# Patient Record
Sex: Male | Born: 1980
Health system: Southern US, Community
[De-identification: ages and names within clinical notes are randomized; demographics above are authoritative.]

## PROBLEM LIST (undated history)

## (undated) DIAGNOSIS — K219 Gastro-esophageal reflux disease without esophagitis: Secondary | ICD-10-CM

## (undated) DIAGNOSIS — I10 Essential (primary) hypertension: Secondary | ICD-10-CM

## (undated) DIAGNOSIS — Z800 Family history of malignant neoplasm of digestive organs: Secondary | ICD-10-CM

## (undated) HISTORY — DX: Essential (primary) hypertension: I10

## (undated) HISTORY — PX: OTHER SURGICAL HISTORY: SHX169

## (undated) HISTORY — DX: Family history of malignant neoplasm of digestive organs: Z80.0

## (undated) HISTORY — DX: Gastro-esophageal reflux disease without esophagitis: K21.9

---

## 2007-12-28 ENCOUNTER — Ambulatory Visit (HOSPITAL_COMMUNITY): Admission: RE | Admit: 2007-12-28 | Discharge: 2007-12-28 | Payer: Self-pay | Admitting: Neurosurgery

## 2008-01-11 ENCOUNTER — Inpatient Hospital Stay (HOSPITAL_COMMUNITY): Admission: RE | Admit: 2008-01-11 | Discharge: 2008-01-13 | Payer: Self-pay | Admitting: Neurosurgery

## 2010-07-08 NOTE — Op Note (Signed)
NAMECOUGAR, IMEL NO.:  [id number]   MEDICAL RECORD NO.:  [id number]          PATIENT TYPE:  INP   LOCATION:  3030                         FACILITY:  [facility]   PHYSICIAN:  Hewitt Shorts, M.D.DATE OF BIRTH:  Jul 09, 1980   DATE OF PROCEDURE:  01/11/2008  DATE OF DISCHARGE:                               OPERATIVE REPORT   PREOPERATIVE DIAGNOSES:  1. Grade 2, L5 on S1 spondylolisthesis.  2. Bilateral L5 pars interarticularis defect.  3. Bilateral L5-S1 neural foraminal stenosis.  4. L5-S1 lumbar disk herniation.  5. Lumbar radiculopathy.   POSTOPERATIVE DIAGNOSES:  1. Grade 2, L5 and S1 spondylolisthesis.  2. Bilateral L5 pars interarticularis defect.  3. Bilateral L5-S1 neural foraminal stenosis.  4. L5-S1 lumbar disk herniation.  5. Lumbar radiculopathy.   PROCEDURE:  L5 Gill procedure with laminectomy and facetectomy and  foraminotomy with decompression of the exiting L5 and S1 nerve roots  bilaterally.  A L5-S1 posterior lumbar interbody fusion with AVS PEEK  interbody implants and mosaic with bone marrow aspirate and L5-S1  posterolateral arthrodesis with radius posterior instrumentation,  infused locally harvested morselized autograft, and mosaic with bone  marrow aspirate with microdissection.   SURGEON:  Hewitt Shorts, MD.   ASSISTANT:  Coletta Memos, MD   ANESTHESIA:  General endotracheal.   INDICATIONS:  The patient is a 30 year old man who presented with low  back pain and left lumbar radicular pain.  He was noted to have a grade  2 spondylolisthesis, L5 and S1 secondary to bilateral L5 pars  interarticularis defect.  There is a large broad-based disk protrusion  with neural foraminal stenosis.  A decision was made to proceed with  decompression and arthrodesis.   PROCEDURE:  The patient was brought to the operating room and placed  under general endotracheal anesthesia.  The patient was turned to a  prone position.  Lumbar region  was prepped with Betadine soap and  solution, and draped in a sterile fashion.  The midline was infiltrated  with local anesthetic with epinephrine.  X-ray was taken.  The L5-S1  level identified and a midline incision was made, carried down through  the subcutaneous tissue.  Bipolar cautery and electrocautery were used  to maintain hemostasis.  Dissection was carried down to the lumbar  fascia, which was incised bilaterally and the paraspinal muscles were  dissected from the spinous process and lamina in a subperiosteal  fashion.  Another x-ray was taken, L5-S1 interlaminar space was  identified, and a dissection was carried down laterally and self-  retaining retractor was placed.  The posterior element of L5 was  extensively loose.  We dissected around it separating the ligamentum  flavum and removing the ligamentum flavum at the L5-S1 level and the L4-  5 level.  We were then able to remove the L5 posterior element en bloc.  It was cleaned to soft tissue and morselized and later used as bone  graft.  Then with magnification and microdissection and microsurgery  technique, we proceeded with decompression.  There was significant  pseudoarthrosis encroaching and impinging on the L5 nerve  roots  bilaterally.  This was carefully removed decompressing the nerve roots  bilaterally.  We removed the thickened ligamentum flavum as well  decompressing actually the S1 nerve roots.   We then proceeded with the diskectomy.  There was a large listhesis  between L5 and S1 with a broad-based disk protrusion, spondylitic.  We  incised the annulus and entered into the disk space and began a  diskectomy using a variety of micro curettes and pituitary rongeurs.  The corresponding lipping from the posterosuperior edge of S1 was  removed using Kerrison punches and the decompression was continued  laterally to decompress the L5 nerve roots on the ventral surface  laterally.  We then proceeded with preparing  the vertebral body  endplates using paddle curette to remove the cartilaginous endplate  surfaces.  We then measured the height of the  intervertebral disk space  and selected a 9-mm height implant, 20 mm of depth.  The C-arm  fluoroscope was then draped and brought into the field to guide our  probing of pedicles.  We first probed the left L5 pedicle and aspirated  bone marrow aspirate from the vertebral body which was injected over a  15-mL strip of Mosaic.  The bone marrow aspirate was then packed within  the AVS PEEK implant and we first placed the implant on the left side.  We packed additional Mosaic with bone marrow aspirate in the midline and  then placed the second implant on the right side.  We placed the implant  on the thecal sac and nerve root, we gently retracted towards the  midline.  Each of the implants was placed using C-arm fluoroscopic  guidance to ensure that they were countersunk relative to the L5  vertebral body, which was more ventral.   We then proceeded with posterolateral arthrodesis.  We went ahead and  probed the pedicles of L5 on the right side and S1 bilaterally.  Each of  the pedicles was examined with a ball probe.  Good bony surfaces were  noted.  We then tapped each of the pedicles with 5.25 mm tap, again  examined with a ball probe.  Good bony surfaces were noted.  Good  threading was noted.  We then placed 5.75-mm screws placing 15-mm screws  at L5 and 40-mm screws at S1.  We used 30-mm prelordosed rods that were  placed within the screw heads and secured with locking caps which were  subsequently tightened against the counter torque.  We decorticated the  transverse processes at L5 bilaterally as well as of the sacral ala and  we packed Infuse Mosaic with bone marrow aspirate and locally harvested  morselized autograft in the lateral gutters.  We took care to avoid any  encroachment or impingement on the nerve roots of thecal sac and this  was  examined at the conclusion of packing the bone graft material.  The  wound had been irrigated numerous times throughout the procedure with  saline solution and bacitracin solution.  Good hemostasis was  established and we proceeded with closure.  The paraspinal muscles were  approximated with interrupted undyed #1 Vicryl sutures.  The deep fascia  with interrupted undyed #1 Vicryl sutures.  Scarpa fascia was closed  with interrupted undyed #1 Vicryl sutures.  The subcutaneous and  subcuticular were closed with interrupted inverted 2-0 undyed Vicryl  sutures.  The skin incision was closed with surgical staples.  The wound  was dressed with Adaptic and sterile gauze and Hypafix.  The procedure  was tolerated well.  The estimated blood loss was 250 mL.  We did use  cell saver during the procedure, but the cell saver technician felt that  there was insufficient blood loss to process the collected specimen.  Sponge and needle count were correct.  Following surgery, the patient  was turned back to the supine position, reversed from the anesthetic,  extubated, and transferred to the recovery room for further care.      Hewitt Shorts, M.D.  Electronically Signed     RWN/MEDQ  D:  01/11/2008  T:  01/12/2008  Job:  [id number]

## 2010-11-25 LAB — CBC
HCT: 44.3
HCT: 47.2
Hemoglobin: 15.7
Hemoglobin: 16.4
MCHC: 34.6
MCHC: 35.6
MCV: 87.5
MCV: 89
Platelets: 226
Platelets: 255
RBC: 5.06
RBC: 5.3
RDW: 12.2
RDW: 12.5
WBC: 5.9
WBC: 8.7

## 2010-11-25 LAB — TYPE AND SCREEN
ABO/RH(D): A POS
ABO/RH(D): A POS
Antibody Screen: NEGATIVE
Antibody Screen: NEGATIVE

## 2010-11-25 LAB — ABO/RH: ABO/RH(D): A POS

## 2014-05-14 ENCOUNTER — Telehealth: Payer: Self-pay | Admitting: Genetic Counselor

## 2014-05-14 NOTE — Telephone Encounter (Signed)
Pt aware of genetic appt on 05/21/14@1 :00

## 2014-05-14 NOTE — Telephone Encounter (Signed)
Called and left pt vm in ref to appt.

## 2014-05-21 ENCOUNTER — Encounter: Payer: Self-pay | Admitting: Genetic Counselor

## 2014-05-21 ENCOUNTER — Other Ambulatory Visit: Payer: BLUE CROSS/BLUE SHIELD

## 2014-05-21 ENCOUNTER — Ambulatory Visit: Payer: Self-pay

## 2014-05-21 ENCOUNTER — Ambulatory Visit (HOSPITAL_BASED_OUTPATIENT_CLINIC_OR_DEPARTMENT_OTHER): Payer: BLUE CROSS/BLUE SHIELD | Admitting: Genetic Counselor

## 2014-05-21 DIAGNOSIS — Z800 Family history of malignant neoplasm of digestive organs: Secondary | ICD-10-CM | POA: Diagnosis not present

## 2014-05-21 DIAGNOSIS — Z315 Encounter for genetic counseling: Secondary | ICD-10-CM

## 2014-05-21 NOTE — Progress Notes (Signed)
REFERRING PROVIDER: No referring provider defined for this encounter.  PRIMARY PROVIDER:  No primary care provider on file.  PRIMARY REASON FOR VISIT:  1. Family history of colon cancer   2. Family history of Lynch syndrome      HISTORY OF PRESENT ILLNESS:   Kyle Tyler, a 34 y.o. male, was seen for a [place] cancer genetics consultation at the request of Dr. No ref. provider found due to a family history of colon cancer and known family mutation in [place].  Kyle Tyler presents to clinic today to discuss the possibility of a hereditary predisposition to cancer, genetic testing, and to further clarify his future cancer risks, as well as potential cancer risks for family members. Kyle Tyler is a 34 y.o. male with no personal history of cancer. He is seen with his mother today.  Kyle Tyler has undergone surgery providing his mother with part of his liver. Kyle Tyler mother has been diagnosed with colon cancer and was found to have an MLH1 mutation that is known in the family.   CANCER HISTORY:   No history exists.     RISK FACTORS:  Colonoscopy: yes; normal. Polyps: no polyps by report Smoking history: former smoker of 1/3 ppd for several years. Alcohol use: rare  Past Medical History  Diagnosis Date  . Family history of colon cancer   . Family history of Lynch syndrome     History reviewed. No pertinent past surgical history.  History   Social History  . Marital Status: Married    Spouse Name: N/A  . Number of Children: N/A  . Years of Education: N/A   Social History Main Topics  . Smoking status: Former Smoker -- 0.33 packs/day for 3 years    Types: Cigarettes  . Smokeless tobacco: Not on file  . Alcohol Use: Yes     Comment: rarely  . Drug Use: Not on file  . Sexual Activity: Not on file   Other Topics Concern  . None   Social History Narrative  . None     FAMILY HISTORY:  We obtained a detailed, 4-generation family history.  Significant diagnoses are  listed below: Family History  Problem Relation Age of Onset  . Colon cancer Mother 59    MLH1 mutation; Lynch syndrome  . Colon cancer Maternal Aunt 84    MLH1 mutation  . Colon cancer Maternal Grandfather 50    recurrance at 39  . Colon cancer Cousin 30    stage IV; Lynch syndrome MLH1  . Colon cancer Other     paternal grandfather's brothers   Kyle Tyler has an 34 year old son who is healthy.  He is an only child.  His mother was diagnosed with colon cancer at 53 and also has an MLH1 mutation.  Kyle Tyler mother had a brother and sister.  Her brother died at 32. Her sister was diagnosed with colon cancer at 71 and was found to have an MLH1 mutation.  Kyle Tyler niece was diagnosed with stage IV colon cancer at age 48 and also has Lynch syndrome.  Kyle Tyler maternal grandfather had colon cancer at 57 and 62, and his two brothers also had colon cancer. Patient's maternal ancestors are of Caucasian descent, and paternal ancestors are of Caucasian descent. There is no reported Ashkenazi Jewish ancestry. There is no known consanguinity.  GENETIC COUNSELING ASSESSMENT: Kyle Tyler is a 34 y.o. male with a family history of colon cancer and known family mutation in  MLH1 and a VUS in MSH6 which somewhat suggestive of a Lynch syndrome and predisposition to cancer. We, therefore, discussed and recommended the following at today's visit.   DISCUSSION: We reviewed the characteristics, features and inheritance patterns of hereditary cancer syndromes. We discussed Lynch syndrome and the increased risk for cancer in the GI tract and male reproductive organs.  We reviewed the increased screening for individuals with Lynch syndrome, as well as discussed the risk for having inherited this mutation.  Based on his mother's test result, Kyle Tyler has a 50% chance of also having this mutation.  If he tests positive we will refer him to our high risk cancer clinic and can also refer him for colonoscopies  to local providers, if he would like.  We also discussed genetic testing, including the appropriate family members to test, the process of testing, insurance coverage and turn-around-time for results. We discussed the implications of a negative and positive result. We recommended Kyle Tyler pursue genetic testing for the [facility] and MSH6 targeted test through [facility], as his mother's test was also through [facility].  PLAN: After considering the risks, benefits, and limitations, Kyle Tyler  provided informed consent to pursue genetic testing and the blood sample was sent to [facility] for a targeted analysis of the MLH1 and MSH6 genes. Results should be available within approximately 2 weeks' time, at which point they will be disclosed by telephone to Kyle Tyler, as will any additional recommendations warranted by these results. Kyle Tyler will receive a summary of his genetic counseling visit and a copy of his results once available. This information will also be available in Epic. We encouraged Kyle Tyler to remain in contact with cancer genetics annually so that we can continuously update the family history and inform him of any changes in cancer genetics and testing that may be of benefit for his family. Kyle Tyler questions were answered to his satisfaction today. Our contact information was provided should additional questions or concerns arise.  Lastly, we encouraged Mr. Whitmoyer to remain in contact with cancer genetics annually so that we can continuously update the family history and inform him of any changes in cancer genetics and testing that may be of benefit for this family.   Mr.  Voong questions were answered to his satisfaction today. Our contact information was provided should additional questions or concerns arise. Thank you for the referral and allowing [place] to share in the care of your patient.   Karen P. Florene Glen, [place], [facility] Certified Genetic  Counselor [email or web address] phone: [phone]  The patient was seen for a total of 45 minutes in face-to-face genetic counseling.  This patient was discussed with Drs. Magrinat, Lindi Adie and/or Burr Medico who agrees with the above.    _______________________________________________________________________ For Office Staff:  Number of people involved in session: 2 Was an Intern/ student involved with case: yes

## 2014-06-01 ENCOUNTER — Encounter: Payer: Self-pay | Admitting: Genetic Counselor

## 2014-06-01 ENCOUNTER — Telehealth: Payer: Self-pay | Admitting: Genetic Counselor

## 2014-06-01 DIAGNOSIS — Z1379 Encounter for other screening for genetic and chromosomal anomalies: Secondary | ICD-10-CM

## 2014-06-01 DIAGNOSIS — Z800 Family history of malignant neoplasm of digestive organs: Secondary | ICD-10-CM

## 2014-06-01 NOTE — Progress Notes (Signed)
HPI: Kyle Tyler was previously seen in the [place] clinic due to a family history of colon cancer cancer and a known family mutation in [place] in his mother causing Lynch syndrome.  Please refer to our prior cancer genetics clinic note for more information regarding Kyle Tyler medical, social and family histories, and our assessment and recommendations, at the time. Kyle Tyler genetic test results were disclosed to him, as were recommendations warranted by these results. These results and recommendations are discussed in more detail below.  GENETIC TEST RESULTS: We recommended Kyle Tyler pursue testing for the familial hereditary cancer gene mutation called MLH1, [id number].  Kyle Tyler test was normal and did not reveal the familial mutation. We call this result a true negative result because the cancer-causing mutation was identified in Kyle Tyler family, and he did not inherit it.  Given this negative result, Kyle Tyler chances of developing Lynch-related cancers are the same as they are in the general population.    Genetic testing did detect a Variant of Unknown Significance in the MSH6 gene called [id number]>T. At this time, it is unknown if this variant is associated with increased cancer risk or if this is a normal finding, but most variants such as this get reclassified to being inconsequential. It should not be used to make medical management decisions. With time, we suspect the lab will determine the significance of this variant, if any. If we do learn more about it, we will try to contact Kyle Tyler to discuss it further. However, it is important to stay in touch with [place] periodically and keep the address and phone number up to date.   ADDITIONAL GENETIC TESTING: We discussed with Kyle Tyler that there are other genes that are associated with increased cancer risk that can be analyzed. The laboratories that offer such testing look at these additional genes via a  hereditary cancer gene panel. Should Kyle Tyler wish to pursue additional genetic testing, we are happy to discuss and coordinate this testing, at any time.    CANCER SCREENING RECOMMENDATIONS: This normal result is reassuring and indicates that Kyle Tyler does not likely have an increased risk of cancer due to a mutation in one of these genes.  We, therefore, recommended  Kyle Tyler continue to follow the cancer screening guidelines provided by his primary healthcare providers.   RECOMMENDATIONS FOR FAMILY MEMBERS: All of Kyle Tyler maternal family members should be tested for the MLH1 mutation.  IF they are positive they would have Lynch syndrome and would need to be followed accordingly.  FOLLOW-UP: Lastly, we discussed with Kyle Tyler that cancer genetics is a rapidly advancing field and it is possible that new genetic tests will be appropriate for him and/or his family members in the future. We encouraged him to remain in contact with cancer genetics on an annual basis so we can update his personal and family histories and let him know of advances in cancer genetics that may benefit this family.   Our contact number was provided. Kyle Tyler questions were answered to his satisfaction, and she knows he is welcome to call us at anytime with additional questions or concerns.   Kyle Kayser, MS, [facility] Certified Genetic Counselor [email or web address]

## 2014-06-01 NOTE — Telephone Encounter (Signed)
Revealed negative testing for the known fmaily mutation.

## 2014-06-01 NOTE — Telephone Encounter (Signed)
LM on VM that we had results back and to please call back.

## 2015-01-21 ENCOUNTER — Other Ambulatory Visit: Payer: Self-pay | Admitting: Sports Medicine

## 2015-01-21 DIAGNOSIS — M25531 Pain in right wrist: Secondary | ICD-10-CM

## 2015-02-07 ENCOUNTER — Ambulatory Visit
Admission: RE | Admit: 2015-02-07 | Discharge: 2015-02-07 | Disposition: A | Discharge status: Home / Self Care | Payer: BLUE CROSS/BLUE SHIELD | Source: Ambulatory Visit | Attending: Sports Medicine | Admitting: Sports Medicine

## 2015-02-07 DIAGNOSIS — M25531 Pain in right wrist: Secondary | ICD-10-CM

## 2015-02-07 MED ORDER — IOHEXOL 180 MG/ML  SOLN: 2.0000 mL | Freq: Once | INTRAMUSCULAR | Status: DC | PRN

## 2016-08-11 ENCOUNTER — Encounter: Payer: Self-pay | Admitting: General Surgery

## 2016-08-11 ENCOUNTER — Ambulatory Visit (INDEPENDENT_AMBULATORY_CARE_PROVIDER_SITE_OTHER): Payer: Managed Care, Other (non HMO) | Admitting: General Surgery

## 2016-08-11 VITALS — BP 142/80 | HR 68 | Temp 98.4°F | Resp 18 | Ht 73.0 in | Wt 253.0 lb

## 2016-08-11 DIAGNOSIS — K432 Incisional hernia without obstruction or gangrene: Secondary | ICD-10-CM | POA: Diagnosis not present

## 2016-08-11 NOTE — Patient Instructions (Signed)
 Ventral Hernia A ventral hernia is a bulge of tissue from inside the abdomen that pushes through a weak area of the muscles that form the front wall of the abdomen. The tissues inside the abdomen are inside a sac (peritoneum). These tissues include the small intestine, large intestine, and the fatty tissue that covers the intestines (omentum). Sometimes, the bulge that forms a hernia contains intestines. Other hernias contain only fat. Ventral hernias do not go away without surgical treatment. There are several types of ventral hernias. You may have:  A hernia at an incision site from previous abdominal surgery (incisional hernia).  A hernia just above the belly button (epigastric hernia), or at the belly button (umbilical hernia). These types of hernias can develop from heavy lifting or straining.  A hernia that comes and goes (reducible hernia). It may be visible only when you lift or strain. This type of hernia can be pushed back into the abdomen (reduced).  A hernia that traps abdominal tissue inside the hernia (incarcerated hernia). This type of hernia does not reduce.  A hernia that cuts off blood flow to the tissues inside the hernia (strangulated hernia). The tissues can start to die if this happens. This is a very painful bulge that cannot be reduced. A strangulated hernia is a medical emergency.  What are the causes? This condition is caused by abdominal tissue putting pressure on an area of weakness in the abdominal muscles. What increases the risk? The following factors may make you more likely to develop this condition:  Being male.  Being 60 or older.  Being overweight or obese.  Having had previous abdominal surgery, especially if there was an infection after surgery.  Having had an injury to the abdominal wall.  Having had several pregnancies.  Having a buildup of fluid inside the abdomen (ascites).  What are the signs or symptoms? The only symptom of a ventral  hernia may be a painless bulge in the abdomen. A reducible hernia may be visible only when you strain, cough, or lift. Other symptoms may include:  Dull pain.  A feeling of pressure.  Signs and symptoms of a strangulated hernia may include:  Increasing pain.  Nausea and vomiting.  Pain when pressing on the hernia.  The skin over the hernia turning red or purple.  Constipation.  Blood in the stool (feces).  How is this diagnosed? This condition may be diagnosed based on:  Your symptoms.  Your medical history.  A physical exam. You may be asked to cough or strain while standing. These actions increase the pressure inside your abdomen and force the hernia through the opening in your muscles. Your health care provider may try to reduce the hernia by pressing on it.  Imaging studies, such as an ultrasound or CT scan.  How is this treated? This condition is treated with surgery. If you have a strangulated hernia, surgery is done as soon as possible. If your hernia is small and not incarcerated, you may be asked to lose some weight before surgery. Follow these instructions at home:  Follow instructions from your health care provider about eating or drinking restrictions.  If you are overweight, your health care provider may recommend that you increase your activity level and eat a healthier diet.  Do not lift anything that is heavier than 10 lb (4.5 kg).  Return to your normal activities as told by your health care provider. Ask your health care provider what activities are safe for you. You   may need to avoid activities that increase pressure on your hernia.  Take over-the-counter and prescription medicines only as told by your health care provider.  Keep all follow-up visits as told by your health care provider. This is important. Contact a health care provider if:  Your hernia gets larger.  Your hernia becomes painful. Get help right away if:  Your hernia becomes  increasingly painful.  You have pain along with any of the following: ? Changes in skin color in the area of the hernia. ? Nausea. ? Vomiting. ? Fever. Summary  A ventral hernia is a bulge of tissue from inside the abdomen that pushes through a weak area of the muscles that form the front wall of the abdomen.  This condition is treated with surgery, which may be urgent depending on your hernia.  Do not lift anything that is heavier than 10 lb (4.5 kg), and follow activity instructions from your health care provider. This information is not intended to replace advice given to you by your health care provider. Make sure you discuss any questions you have with your health care provider. Document Released: 01/27/2012 Document Revised: 09/27/2015 Document Reviewed: 09/27/2015 Elsevier Interactive Patient Education  2018 [employer]  

## 2016-08-11 NOTE — Progress Notes (Signed)
Kyle Tyler; [id number]; 11-22-1980   HPI   Patient is a 36 year old white male who was referred to my care by Dr. Nadara Mustard for evaluation and treatment of incisional hernia.  He is status post donor partial hepatectomy at [facility] for his mother.  This was done 3 years ago.  He states he did well after the surgery, but over time, he did gain weight and started noticing swelling along the incision line.  This was made worse with straining.  He has no pain with this.  No nausea or vomiting noted. Past Medical History:  Diagnosis Date  . Family history of colon cancer   . Family history of Lynch syndrome     No past surgical history on file.  Family History  Problem Relation Age of Onset  . Colon cancer Mother 36       MLH1 mutation; Lynch syndrome  . Colon cancer Maternal Aunt 53       MLH1 mutation  . Colon cancer Maternal Grandfather 50       recurrance at 66  . Colon cancer Cousin 30       stage IV; Lynch syndrome MLH1  . Colon cancer Other        paternal grandfather's brothers    No current outpatient prescriptions on file prior to visit.   No current facility-administered medications on file prior to visit.     Not on File  History  Alcohol Use  . Yes    Comment: rarely    History  Smoking Status  . Former Smoker  . Packs/day: 0.33  . Years: 3.00  . Types: Cigarettes  Smokeless Tobacco  . Never Used    Review of Systems  Constitutional: Negative.   HENT: Negative.   Eyes: Negative.   Respiratory: Negative.   Cardiovascular: Negative.   Gastrointestinal: Negative.   Genitourinary: Negative.   Musculoskeletal: Negative.   Skin: Negative.   Neurological: Negative.   Endo/Heme/Allergies: Negative.   Psychiatric/Behavioral: Negative.     Objective   Vitals:   08/11/16 1541  BP: (!) 142/80  Pulse: 68  Resp: 18  Temp: 98.4 F (36.9 C)    Physical Exam  Constitutional: He is oriented to person, place, and time and  well-developed, well-nourished, and in no distress.  HENT:  Head: Normocephalic and atraumatic.  Cardiovascular: Normal rate, regular rhythm and normal heart sounds.   No murmur heard. Pulmonary/Chest: Effort normal and breath sounds normal. He has no wheezes. He has no rales.  Abdominal: Soft. Bowel sounds are normal. He exhibits no distension. There is no tenderness.  A large midline incision is noted with almost complete incisional hernia present along its length.  The rectus muscle is palpable, but significantly lateralized along the abdominal wall.  Neurological: He is alert and oriented to person, place, and time.  Skin: Skin is warm and dry.  Vitals reviewed.   Assessment    Incisional hernia Plan    given the very large hernia defect and lateral displacement of the rectus muscle, I told the patient that I felt he would benefit from possible component separation hernia repair.  I do not perform this procedure.  I will refer him back to [facility] for evaluation and treatment.  He understands this and agrees.

## 2017-07-28 IMAGING — MR MR WRIST*R* W/ CM
4 of 5 series · 23 of 40 positions shown · non-contrast
Comparison: Limited injection images from 02/07/2015

CLINICAL DATA: Right wrist pain for 2 months since using a
lawnmower and jamming the wrist. Medial wrist pain.

EXAM:
MR OF THE RIGHT WRIST WITH INTRA-ARTICULAR CONTRAST
TECHNIQUE: Multiplanar, multisequence MR imaging of the right wrist was
performed following the administration of intra-articular contrast
in the radiocarpal joint.

[Series 5: T2 fat-sat · axial · 3.5mm · 0.20mm/px · z∈[-52,+23]mm · 7 of 20 slices shown (1 of 2)]
[im 1/20]
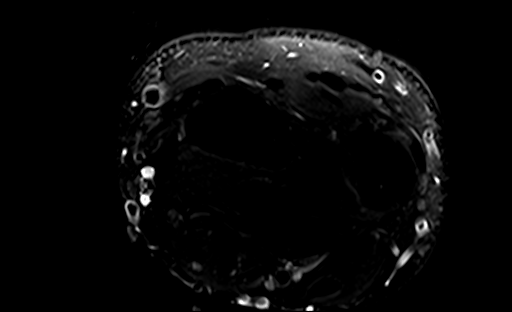
[im 4/20]
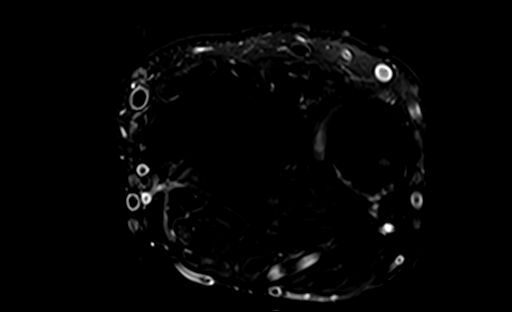
[im 7/20]
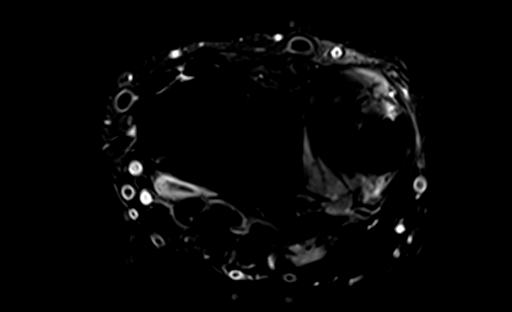
[im 10/20]
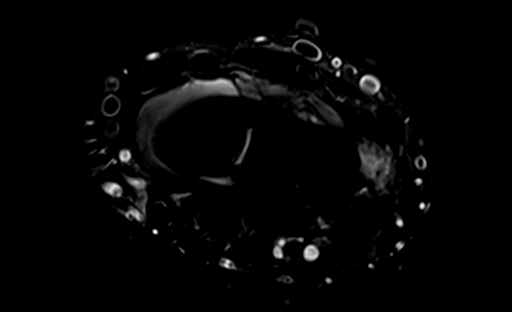
[im 13/20]
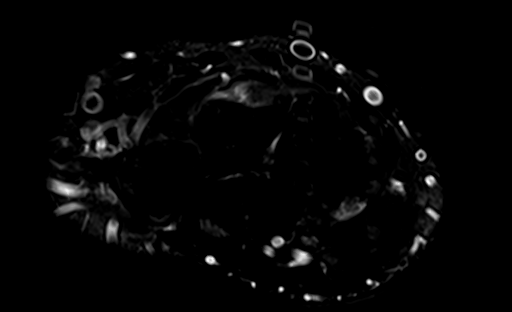
[im 16/20]
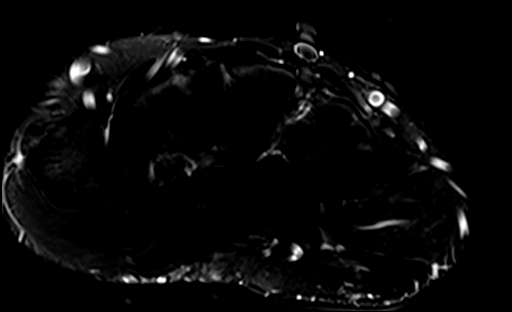
[im 20/20]
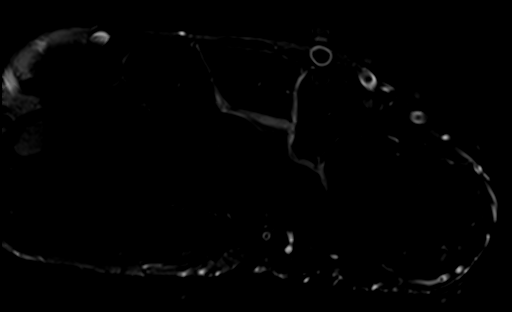

[Series 6: T1 fat-sat · coronal · 2.5mm · 0.20mm/px · 5 of 21 slices shown (1 of 2)]
[im 1/21]
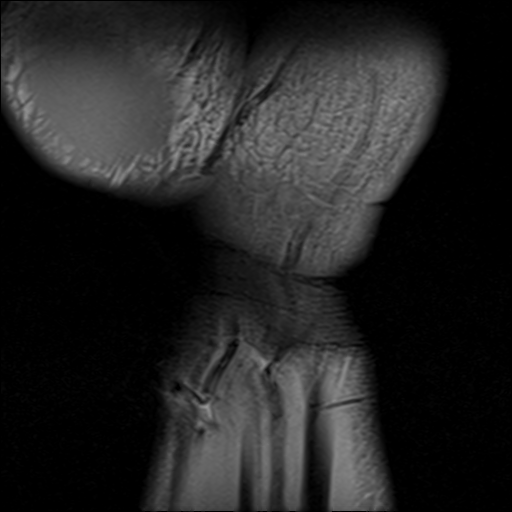
[im 3/21]
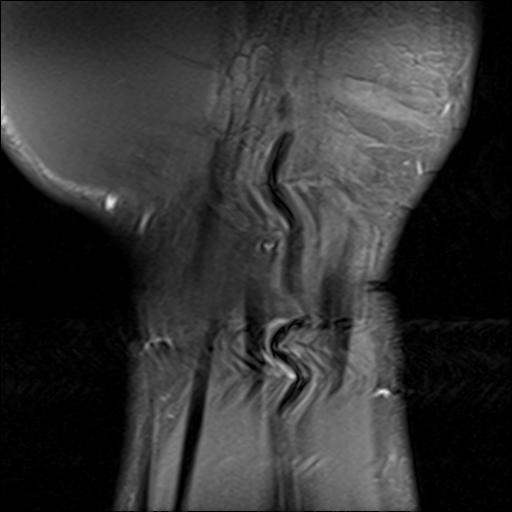
[im 6/21]
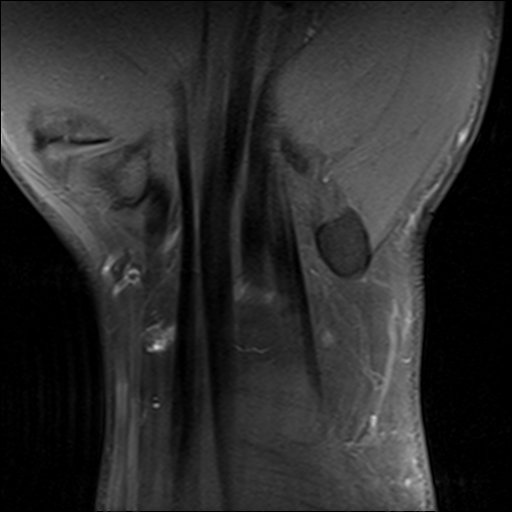
[im 12/21]
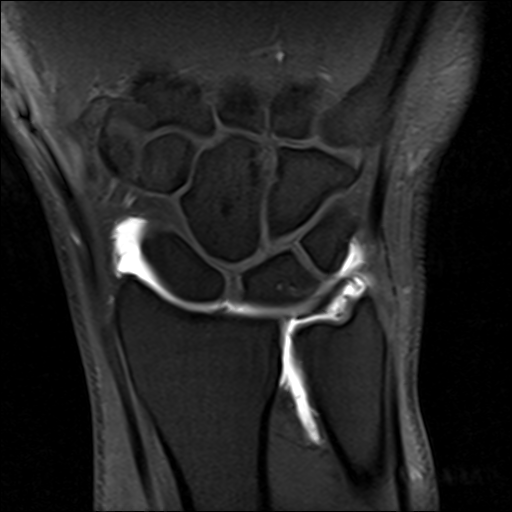
[im 18/21]
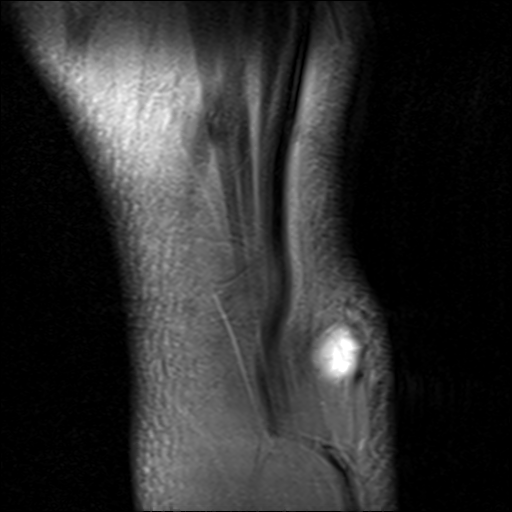

[Series 7: T1 fat-sat · sagittal · 3.0mm · 0.20mm/px · 3 of 25 slices shown (2 of 2)]
[im 4/25]
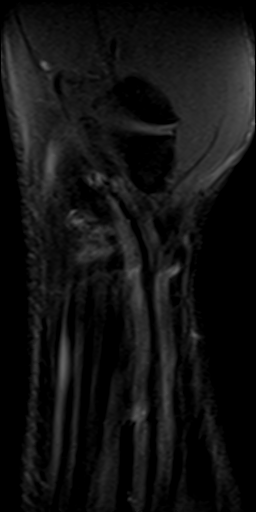
[im 13/25]
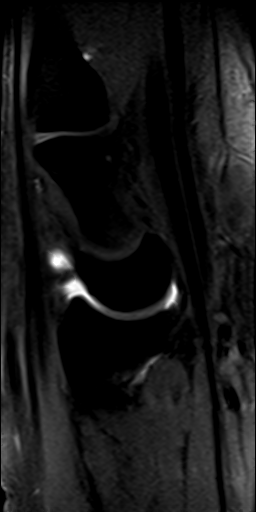
[im 22/25]
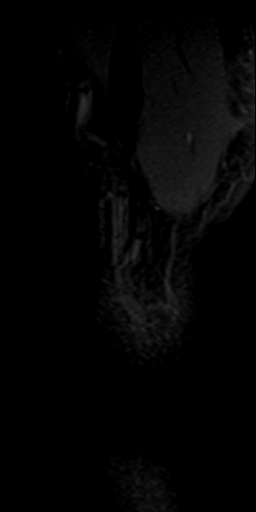

[Series 9: T2 fat-sat · coronal · 2.5mm · 0.20mm/px · 8 of 21 slices shown (2 of 2)]
[im 1/21]
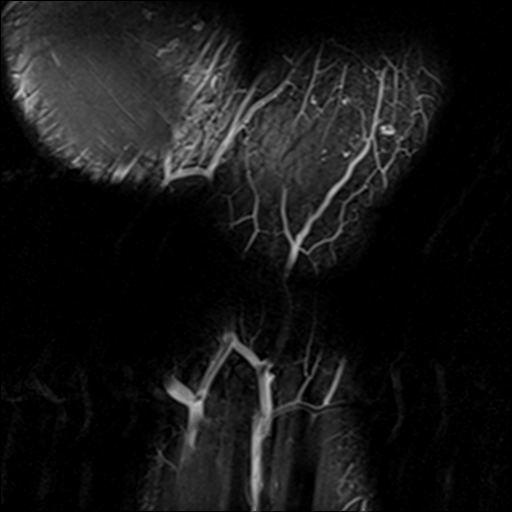
[im 3/21]
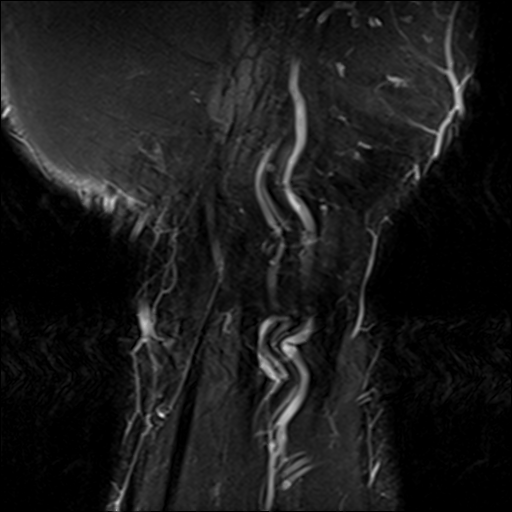
[im 6/21]
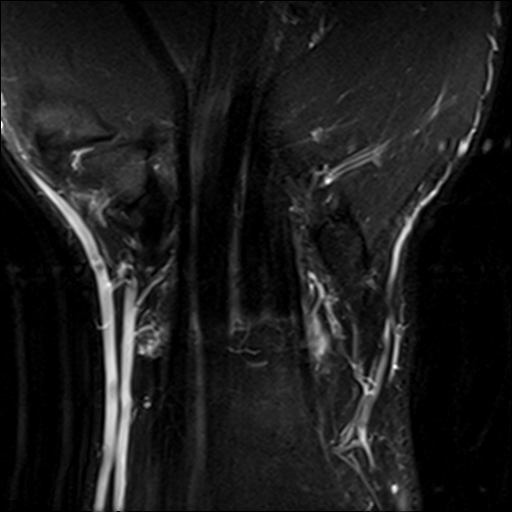
[im 9/21]
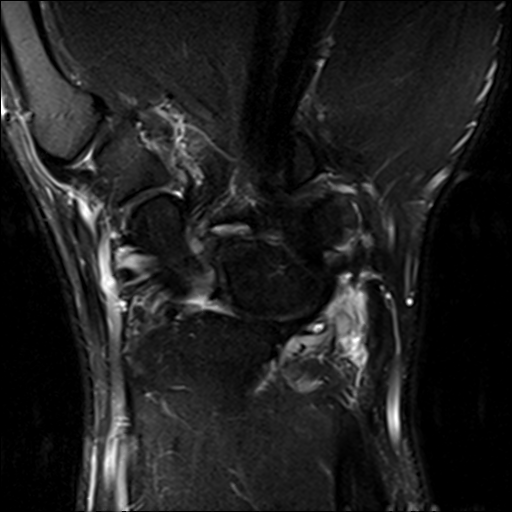
[im 12/21]
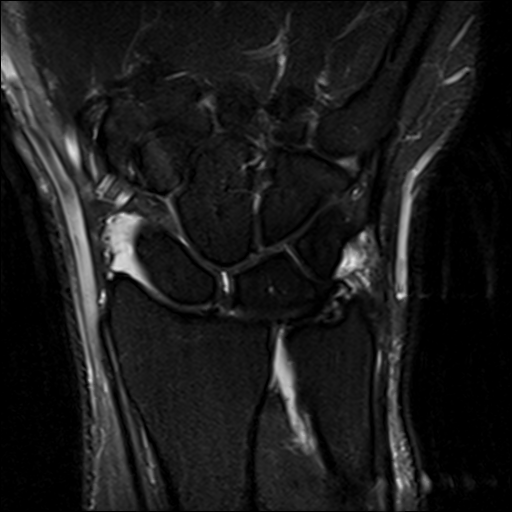
[im 15/21]
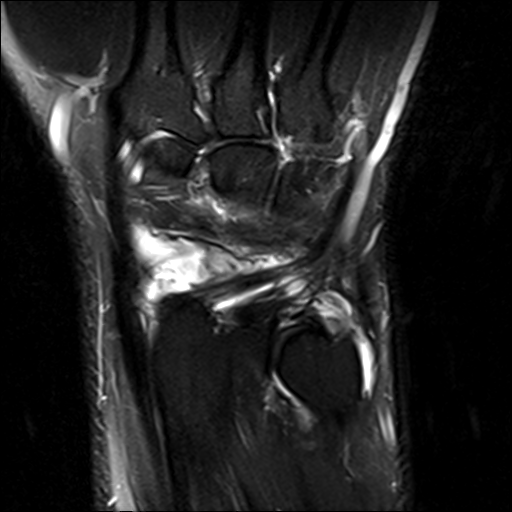
[im 18/21]
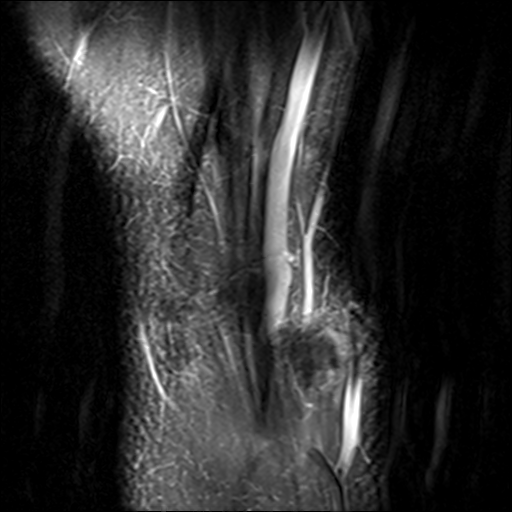
[im 21/21]
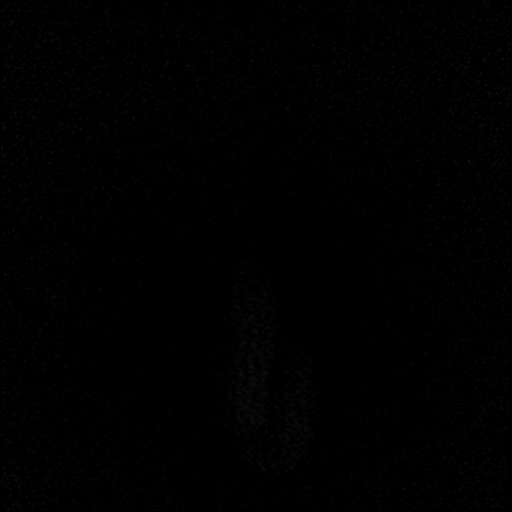

[23 of 40 positions shown; findings below may reference images not displayed]

FINDINGS: Ligaments: Unremarkable

Triangular fibrocartilage: Type 1a tear of the triangular
fibrocartilage disc at the radial attachment, image 11 series 6,
allowing contrast medium to extend freely into the distal radioulnar
joint. There is also some undersurface irregularity of the TFCC
attachment of the ulnar styloid, but without full-thickness tear in
this vicinity.

Tendons: Unremarkable

Carpal tunnel/median nerve: Unremarkable

Guyon's canal: Unremarkable

Joint/cartilage: Unremarkable

Bones/carpal alignment: Unremarkable

Other: No supplemental non-categorized findings.
IMPRESSION: 1. Small type 1a tear of the triangular fibrocartilage disc at the
radial attachment, allowing contrast medium to extend into the
distal radioulnar joint. There is also some attenuation of the
distal attachment of the TFC to the ulnar styloid, although without
full-thickness tear in this vicinity.

## 2018-03-28 DIAGNOSIS — J019 Acute sinusitis, unspecified: Secondary | ICD-10-CM | POA: Diagnosis not present

## 2018-03-28 DIAGNOSIS — Z6833 Body mass index (BMI) 33.0-33.9, adult: Secondary | ICD-10-CM | POA: Diagnosis not present

## 2018-03-28 DIAGNOSIS — R05 Cough: Secondary | ICD-10-CM | POA: Diagnosis not present

## 2018-10-14 DIAGNOSIS — Z6833 Body mass index (BMI) 33.0-33.9, adult: Secondary | ICD-10-CM | POA: Diagnosis not present

## 2018-10-14 DIAGNOSIS — M25562 Pain in left knee: Secondary | ICD-10-CM | POA: Diagnosis not present

## 2018-10-28 DIAGNOSIS — M222X2 Patellofemoral disorders, left knee: Secondary | ICD-10-CM | POA: Diagnosis not present

## 2018-10-28 DIAGNOSIS — Z6833 Body mass index (BMI) 33.0-33.9, adult: Secondary | ICD-10-CM | POA: Diagnosis not present

## 2018-11-14 DIAGNOSIS — R509 Fever, unspecified: Secondary | ICD-10-CM | POA: Diagnosis not present

## 2018-11-22 ENCOUNTER — Other Ambulatory Visit: Payer: Self-pay

## 2018-11-22 DIAGNOSIS — Z20822 Contact with and (suspected) exposure to covid-19: Secondary | ICD-10-CM

## 2018-11-23 LAB — NOVEL CORONAVIRUS, NAA: SARS-CoV-2, NAA: NOT DETECTED

## 2018-12-01 DIAGNOSIS — M7652 Patellar tendinitis, left knee: Secondary | ICD-10-CM | POA: Diagnosis not present

## 2018-12-01 DIAGNOSIS — M222X2 Patellofemoral disorders, left knee: Secondary | ICD-10-CM | POA: Diagnosis not present

## 2018-12-01 DIAGNOSIS — Z6834 Body mass index (BMI) 34.0-34.9, adult: Secondary | ICD-10-CM | POA: Diagnosis not present

## 2018-12-05 ENCOUNTER — Other Ambulatory Visit: Payer: Self-pay | Admitting: Family Medicine

## 2018-12-05 DIAGNOSIS — S8992XA Unspecified injury of left lower leg, initial encounter: Secondary | ICD-10-CM

## 2018-12-05 DIAGNOSIS — M25562 Pain in left knee: Secondary | ICD-10-CM

## 2018-12-30 DIAGNOSIS — M25569 Pain in unspecified knee: Secondary | ICD-10-CM | POA: Diagnosis not present

## 2019-01-12 DIAGNOSIS — I10 Essential (primary) hypertension: Secondary | ICD-10-CM | POA: Diagnosis not present

## 2019-01-12 DIAGNOSIS — Z6833 Body mass index (BMI) 33.0-33.9, adult: Secondary | ICD-10-CM | POA: Diagnosis not present

## 2019-02-15 DIAGNOSIS — S86812A Strain of other muscle(s) and tendon(s) at lower leg level, left leg, initial encounter: Secondary | ICD-10-CM | POA: Diagnosis not present

## 2019-02-15 DIAGNOSIS — M66262 Spontaneous rupture of extensor tendons, left lower leg: Secondary | ICD-10-CM | POA: Diagnosis not present

## 2019-02-15 DIAGNOSIS — G8918 Other acute postprocedural pain: Secondary | ICD-10-CM | POA: Diagnosis not present

## 2019-02-27 DIAGNOSIS — M66262 Spontaneous rupture of extensor tendons, left lower leg: Secondary | ICD-10-CM | POA: Diagnosis not present

## 2019-03-13 DIAGNOSIS — M25562 Pain in left knee: Secondary | ICD-10-CM | POA: Diagnosis not present

## 2019-03-13 DIAGNOSIS — R262 Difficulty in walking, not elsewhere classified: Secondary | ICD-10-CM | POA: Diagnosis not present

## 2019-03-13 DIAGNOSIS — M6281 Muscle weakness (generalized): Secondary | ICD-10-CM | POA: Diagnosis not present

## 2019-03-13 DIAGNOSIS — M25662 Stiffness of left knee, not elsewhere classified: Secondary | ICD-10-CM | POA: Diagnosis not present

## 2019-03-15 DIAGNOSIS — M6281 Muscle weakness (generalized): Secondary | ICD-10-CM | POA: Diagnosis not present

## 2019-03-15 DIAGNOSIS — M25562 Pain in left knee: Secondary | ICD-10-CM | POA: Diagnosis not present

## 2019-03-15 DIAGNOSIS — R262 Difficulty in walking, not elsewhere classified: Secondary | ICD-10-CM | POA: Diagnosis not present

## 2019-03-15 DIAGNOSIS — M25662 Stiffness of left knee, not elsewhere classified: Secondary | ICD-10-CM | POA: Diagnosis not present

## 2019-03-20 DIAGNOSIS — M25662 Stiffness of left knee, not elsewhere classified: Secondary | ICD-10-CM | POA: Diagnosis not present

## 2019-03-20 DIAGNOSIS — M6281 Muscle weakness (generalized): Secondary | ICD-10-CM | POA: Diagnosis not present

## 2019-03-20 DIAGNOSIS — M25562 Pain in left knee: Secondary | ICD-10-CM | POA: Diagnosis not present

## 2019-03-20 DIAGNOSIS — R262 Difficulty in walking, not elsewhere classified: Secondary | ICD-10-CM | POA: Diagnosis not present

## 2019-03-22 DIAGNOSIS — R262 Difficulty in walking, not elsewhere classified: Secondary | ICD-10-CM | POA: Diagnosis not present

## 2019-03-22 DIAGNOSIS — M25662 Stiffness of left knee, not elsewhere classified: Secondary | ICD-10-CM | POA: Diagnosis not present

## 2019-03-22 DIAGNOSIS — Z23 Encounter for immunization: Secondary | ICD-10-CM | POA: Diagnosis not present

## 2019-03-22 DIAGNOSIS — M25562 Pain in left knee: Secondary | ICD-10-CM | POA: Diagnosis not present

## 2019-03-22 DIAGNOSIS — M6281 Muscle weakness (generalized): Secondary | ICD-10-CM | POA: Diagnosis not present

## 2019-03-27 DIAGNOSIS — M25662 Stiffness of left knee, not elsewhere classified: Secondary | ICD-10-CM | POA: Diagnosis not present

## 2019-03-27 DIAGNOSIS — R262 Difficulty in walking, not elsewhere classified: Secondary | ICD-10-CM | POA: Diagnosis not present

## 2019-03-27 DIAGNOSIS — M25562 Pain in left knee: Secondary | ICD-10-CM | POA: Diagnosis not present

## 2019-03-27 DIAGNOSIS — M6281 Muscle weakness (generalized): Secondary | ICD-10-CM | POA: Diagnosis not present

## 2019-03-29 DIAGNOSIS — M25562 Pain in left knee: Secondary | ICD-10-CM | POA: Diagnosis not present

## 2019-03-29 DIAGNOSIS — M6281 Muscle weakness (generalized): Secondary | ICD-10-CM | POA: Diagnosis not present

## 2019-03-29 DIAGNOSIS — R262 Difficulty in walking, not elsewhere classified: Secondary | ICD-10-CM | POA: Diagnosis not present

## 2019-03-29 DIAGNOSIS — M25662 Stiffness of left knee, not elsewhere classified: Secondary | ICD-10-CM | POA: Diagnosis not present

## 2019-04-03 DIAGNOSIS — M25662 Stiffness of left knee, not elsewhere classified: Secondary | ICD-10-CM | POA: Diagnosis not present

## 2019-04-03 DIAGNOSIS — M25562 Pain in left knee: Secondary | ICD-10-CM | POA: Diagnosis not present

## 2019-04-03 DIAGNOSIS — R262 Difficulty in walking, not elsewhere classified: Secondary | ICD-10-CM | POA: Diagnosis not present

## 2019-04-03 DIAGNOSIS — M6281 Muscle weakness (generalized): Secondary | ICD-10-CM | POA: Diagnosis not present

## 2019-04-05 DIAGNOSIS — M25562 Pain in left knee: Secondary | ICD-10-CM | POA: Diagnosis not present

## 2019-04-05 DIAGNOSIS — M25662 Stiffness of left knee, not elsewhere classified: Secondary | ICD-10-CM | POA: Diagnosis not present

## 2019-04-05 DIAGNOSIS — R262 Difficulty in walking, not elsewhere classified: Secondary | ICD-10-CM | POA: Diagnosis not present

## 2019-04-05 DIAGNOSIS — M6281 Muscle weakness (generalized): Secondary | ICD-10-CM | POA: Diagnosis not present

## 2019-04-12 DIAGNOSIS — M6281 Muscle weakness (generalized): Secondary | ICD-10-CM | POA: Diagnosis not present

## 2019-04-12 DIAGNOSIS — R262 Difficulty in walking, not elsewhere classified: Secondary | ICD-10-CM | POA: Diagnosis not present

## 2019-04-12 DIAGNOSIS — M25662 Stiffness of left knee, not elsewhere classified: Secondary | ICD-10-CM | POA: Diagnosis not present

## 2019-04-12 DIAGNOSIS — M25562 Pain in left knee: Secondary | ICD-10-CM | POA: Diagnosis not present

## 2019-04-21 DIAGNOSIS — Z23 Encounter for immunization: Secondary | ICD-10-CM | POA: Diagnosis not present

## 2019-05-08 DIAGNOSIS — R262 Difficulty in walking, not elsewhere classified: Secondary | ICD-10-CM | POA: Diagnosis not present

## 2019-05-08 DIAGNOSIS — M25662 Stiffness of left knee, not elsewhere classified: Secondary | ICD-10-CM | POA: Diagnosis not present

## 2019-05-08 DIAGNOSIS — M6281 Muscle weakness (generalized): Secondary | ICD-10-CM | POA: Diagnosis not present

## 2019-05-08 DIAGNOSIS — M25562 Pain in left knee: Secondary | ICD-10-CM | POA: Diagnosis not present

## 2019-05-12 DIAGNOSIS — R262 Difficulty in walking, not elsewhere classified: Secondary | ICD-10-CM | POA: Diagnosis not present

## 2019-05-12 DIAGNOSIS — M25562 Pain in left knee: Secondary | ICD-10-CM | POA: Diagnosis not present

## 2019-05-12 DIAGNOSIS — M25662 Stiffness of left knee, not elsewhere classified: Secondary | ICD-10-CM | POA: Diagnosis not present

## 2019-05-12 DIAGNOSIS — M6281 Muscle weakness (generalized): Secondary | ICD-10-CM | POA: Diagnosis not present

## 2019-05-19 DIAGNOSIS — R262 Difficulty in walking, not elsewhere classified: Secondary | ICD-10-CM | POA: Diagnosis not present

## 2019-05-19 DIAGNOSIS — M6281 Muscle weakness (generalized): Secondary | ICD-10-CM | POA: Diagnosis not present

## 2019-05-19 DIAGNOSIS — M25562 Pain in left knee: Secondary | ICD-10-CM | POA: Diagnosis not present

## 2019-05-19 DIAGNOSIS — M25662 Stiffness of left knee, not elsewhere classified: Secondary | ICD-10-CM | POA: Diagnosis not present

## 2019-05-25 DIAGNOSIS — M6281 Muscle weakness (generalized): Secondary | ICD-10-CM | POA: Diagnosis not present

## 2019-05-25 DIAGNOSIS — M25662 Stiffness of left knee, not elsewhere classified: Secondary | ICD-10-CM | POA: Diagnosis not present

## 2019-05-25 DIAGNOSIS — M25562 Pain in left knee: Secondary | ICD-10-CM | POA: Diagnosis not present

## 2019-05-25 DIAGNOSIS — R262 Difficulty in walking, not elsewhere classified: Secondary | ICD-10-CM | POA: Diagnosis not present

## 2019-06-01 DIAGNOSIS — M25662 Stiffness of left knee, not elsewhere classified: Secondary | ICD-10-CM | POA: Diagnosis not present

## 2019-06-01 DIAGNOSIS — M6281 Muscle weakness (generalized): Secondary | ICD-10-CM | POA: Diagnosis not present

## 2019-06-01 DIAGNOSIS — R262 Difficulty in walking, not elsewhere classified: Secondary | ICD-10-CM | POA: Diagnosis not present

## 2019-06-01 DIAGNOSIS — M25562 Pain in left knee: Secondary | ICD-10-CM | POA: Diagnosis not present

## 2019-06-08 DIAGNOSIS — R262 Difficulty in walking, not elsewhere classified: Secondary | ICD-10-CM | POA: Diagnosis not present

## 2019-06-08 DIAGNOSIS — M25662 Stiffness of left knee, not elsewhere classified: Secondary | ICD-10-CM | POA: Diagnosis not present

## 2019-06-08 DIAGNOSIS — M25562 Pain in left knee: Secondary | ICD-10-CM | POA: Diagnosis not present

## 2019-06-08 DIAGNOSIS — M6281 Muscle weakness (generalized): Secondary | ICD-10-CM | POA: Diagnosis not present

## 2019-06-16 DIAGNOSIS — M25562 Pain in left knee: Secondary | ICD-10-CM | POA: Diagnosis not present

## 2019-06-16 DIAGNOSIS — M6281 Muscle weakness (generalized): Secondary | ICD-10-CM | POA: Diagnosis not present

## 2019-06-16 DIAGNOSIS — M25662 Stiffness of left knee, not elsewhere classified: Secondary | ICD-10-CM | POA: Diagnosis not present

## 2019-06-16 DIAGNOSIS — R262 Difficulty in walking, not elsewhere classified: Secondary | ICD-10-CM | POA: Diagnosis not present

## 2019-06-22 DIAGNOSIS — M25662 Stiffness of left knee, not elsewhere classified: Secondary | ICD-10-CM | POA: Diagnosis not present

## 2019-06-22 DIAGNOSIS — M25562 Pain in left knee: Secondary | ICD-10-CM | POA: Diagnosis not present

## 2019-06-22 DIAGNOSIS — R262 Difficulty in walking, not elsewhere classified: Secondary | ICD-10-CM | POA: Diagnosis not present

## 2019-06-22 DIAGNOSIS — M6281 Muscle weakness (generalized): Secondary | ICD-10-CM | POA: Diagnosis not present

## 2019-07-06 DIAGNOSIS — M25662 Stiffness of left knee, not elsewhere classified: Secondary | ICD-10-CM | POA: Diagnosis not present

## 2019-07-06 DIAGNOSIS — M6281 Muscle weakness (generalized): Secondary | ICD-10-CM | POA: Diagnosis not present

## 2019-07-06 DIAGNOSIS — M25562 Pain in left knee: Secondary | ICD-10-CM | POA: Diagnosis not present

## 2019-07-06 DIAGNOSIS — R262 Difficulty in walking, not elsewhere classified: Secondary | ICD-10-CM | POA: Diagnosis not present

## 2019-07-13 DIAGNOSIS — M25662 Stiffness of left knee, not elsewhere classified: Secondary | ICD-10-CM | POA: Diagnosis not present

## 2019-07-13 DIAGNOSIS — M25562 Pain in left knee: Secondary | ICD-10-CM | POA: Diagnosis not present

## 2019-07-13 DIAGNOSIS — R262 Difficulty in walking, not elsewhere classified: Secondary | ICD-10-CM | POA: Diagnosis not present

## 2019-07-13 DIAGNOSIS — M6281 Muscle weakness (generalized): Secondary | ICD-10-CM | POA: Diagnosis not present

## 2019-08-10 DIAGNOSIS — M25562 Pain in left knee: Secondary | ICD-10-CM | POA: Diagnosis not present

## 2019-09-28 DIAGNOSIS — Z6834 Body mass index (BMI) 34.0-34.9, adult: Secondary | ICD-10-CM | POA: Diagnosis not present

## 2019-09-28 DIAGNOSIS — Z1331 Encounter for screening for depression: Secondary | ICD-10-CM | POA: Diagnosis not present

## 2019-09-28 DIAGNOSIS — G44209 Tension-type headache, unspecified, not intractable: Secondary | ICD-10-CM | POA: Diagnosis not present

## 2019-09-28 DIAGNOSIS — Z1389 Encounter for screening for other disorder: Secondary | ICD-10-CM | POA: Diagnosis not present

## 2019-09-28 DIAGNOSIS — I10 Essential (primary) hypertension: Secondary | ICD-10-CM | POA: Diagnosis not present

## 2019-10-12 DIAGNOSIS — R7989 Other specified abnormal findings of blood chemistry: Secondary | ICD-10-CM | POA: Diagnosis not present

## 2019-10-12 DIAGNOSIS — Z6834 Body mass index (BMI) 34.0-34.9, adult: Secondary | ICD-10-CM | POA: Diagnosis not present

## 2019-10-12 DIAGNOSIS — I10 Essential (primary) hypertension: Secondary | ICD-10-CM | POA: Diagnosis not present

## 2019-10-12 DIAGNOSIS — G44209 Tension-type headache, unspecified, not intractable: Secondary | ICD-10-CM | POA: Diagnosis not present

## 2020-01-11 DIAGNOSIS — H6692 Otitis media, unspecified, left ear: Secondary | ICD-10-CM | POA: Diagnosis not present

## 2020-02-01 DIAGNOSIS — G44209 Tension-type headache, unspecified, not intractable: Secondary | ICD-10-CM | POA: Diagnosis not present

## 2020-02-01 DIAGNOSIS — R7989 Other specified abnormal findings of blood chemistry: Secondary | ICD-10-CM | POA: Diagnosis not present

## 2020-02-01 DIAGNOSIS — G4733 Obstructive sleep apnea (adult) (pediatric): Secondary | ICD-10-CM | POA: Diagnosis not present

## 2020-02-01 DIAGNOSIS — I10 Essential (primary) hypertension: Secondary | ICD-10-CM | POA: Diagnosis not present

## 2021-12-08 ENCOUNTER — Encounter (INDEPENDENT_AMBULATORY_CARE_PROVIDER_SITE_OTHER): Payer: Self-pay | Admitting: *Deleted

## 2022-01-22 ENCOUNTER — Encounter: Payer: Self-pay | Admitting: Internal Medicine

## 2022-01-22 ENCOUNTER — Ambulatory Visit (INDEPENDENT_AMBULATORY_CARE_PROVIDER_SITE_OTHER): Payer: BC Managed Care – PPO | Admitting: Internal Medicine

## 2022-01-22 VITALS — BP 165/93 | HR 67 | Temp 98.4°F | Ht 73.0 in | Wt 270.6 lb

## 2022-01-22 DIAGNOSIS — R7989 Other specified abnormal findings of blood chemistry: Secondary | ICD-10-CM | POA: Diagnosis not present

## 2022-01-22 DIAGNOSIS — R197 Diarrhea, unspecified: Secondary | ICD-10-CM

## 2022-01-22 DIAGNOSIS — R1030 Lower abdominal pain, unspecified: Secondary | ICD-10-CM

## 2022-01-22 DIAGNOSIS — K760 Fatty (change of) liver, not elsewhere classified: Secondary | ICD-10-CM

## 2022-01-22 DIAGNOSIS — K219 Gastro-esophageal reflux disease without esophagitis: Secondary | ICD-10-CM | POA: Diagnosis not present

## 2022-01-22 NOTE — Patient Instructions (Addendum)
In regards to your abnormal liver test, likely this is due to nonalcoholic fatty liver disease.  I am going to request your recent ultrasound from [facility] as well as updated blood work.  We will keep an eye on this.  Recommend 1-2# weight loss per week until ideal body weight through exercise & diet. Low fat/cholesterol diet.   Avoid sweets, sodas, fruit juices, sweetened beverages like tea, etc. Gradually increase exercise from 15 min daily up to 1 hr per day 5 days/week. Limit alcohol use.  Continue on omeprazole for your chronic reflux.  In regards to your chronic diarrhea and abdominal pain, would recommend being more aggressive about taking Imodium 20 to 30 minutes before meals.  Especially if you are eating out.  We have prescription medications we can try but we will start with Imodium first.  I am going to order blood work at [facility] to screen you for celiac disease as well as check a CRP which is an inflammatory marker.  We will call with these results.  Follow-up with GI in 3 months.  It was very nice meeting you today.  Dr. Marletta Lor  Nonalcoholic Fatty Liver Disease Diet, Adult Nonalcoholic fatty liver disease is a condition that causes fat to build up in and around the liver. The disease makes it harder for the liver to work the way that it should. Following a healthy diet can help to keep nonalcoholic fatty liver disease under control. It can also help to prevent or improve conditions that are associated with the disease, such as heart disease, diabetes, high blood pressure, and abnormal cholesterol levels. Along with regular exercise, this diet: Promotes weight loss. Helps to control blood sugar levels. Helps to improve the way that the body uses insulin. What are tips for following this plan? Reading food labels  Always check food labels for: The amount of saturated fat in a food. You should limit your intake of saturated fat. Saturated fat is found in foods that come from  animals, including meat and dairy products such as butter, cheese, and whole milk. The amount of fiber in a food. You should choose high-fiber foods such as fruits, vegetables, and whole grains. Try to get 25-30 grams (g) of fiber a day.   Cooking When cooking, use heart-healthy oils that are high in monounsaturated fats. These include olive oil, canola oil, and avocado oil. Limit frying or deep-frying foods. Cook foods using healthy methods such as baking, boiling, steaming, and grilling instead. Meal planning You may want to keep track of how many calories you take in. Eating the right amount of calories will help you achieve a healthy weight. Meeting with a registered dietitian can help you get started. Limit how often you eat takeout and fast food. These foods are usually very high in fat, salt, and sugar. Use the glycemic index (GI) to plan your meals. The index tells you how quickly a food will raise your blood sugar. Choose low-GI foods (GI less than 55). These foods take a longer time to raise blood sugar. A registered dietitian can help you identify foods lower on the GI scale. Lifestyle You may want to follow a Mediterranean diet. This diet includes a lot of vegetables, lean meats or fish, whole grains, fruits, and healthy oils and fats. What foods can I eat?    Fruits Bananas. Apples. Oranges. Grapes. Papaya. Mango. Pomegranate. Kiwi. Grapefruit. Cherries. Vegetables Lettuce. Spinach. Peas. Beets. Cauliflower. Cabbage. Broccoli. Carrots. Tomatoes. Squash. Eggplant. Herbs. Peppers. Onions. Cucumbers. [address]  sprouts. Yams and sweet potatoes. Beans. Lentils. Grains Whole wheat or whole-grain foods, including breads, crackers, cereals, and pasta. Stone-ground whole wheat. Unsweetened oatmeal. Bulgur. Barley. Quinoa. Brown or wild rice. Corn or whole wheat flour tortillas. Meats and other proteins Lean meats. Poultry. Tofu. Seafood and shellfish. Dairy Low-fat or fat-free dairy  products, such as yogurt, cottage cheese, or cheese. Beverages Water. Sugar-free drinks. Tea. Coffee. Low-fat or skim milk. Milk alternatives, such as soy or almond milk. Real fruit juice. Fats and oils Avocado. Canola or olive oil. Nuts and nut butters. Seeds. Seasonings and condiments Mustard. Relish. Low-fat, low-sugar ketchup and barbecue sauce. Low-fat or fat-free mayonnaise. Sweets and desserts Sugar-free sweets. The items listed above may not be a complete list of foods and beverages you can eat. Contact a dietitian for more information. What foods should I limit or avoid? Meats and other proteins Limit red meat to 1-2 times a week. Dairy [employer]. Fats and oils Palm oil and coconut oil. Fried foods. Other foods Processed foods. Foods that contain a lot of salt or sodium. Sweets and desserts Sweets that contain sugar. Beverages Sweetened drinks, such as sweet tea, milkshakes, iced sweet drinks, and sodas. Alcohol. The items listed above may not be a complete list of foods and beverages you should avoid. Contact a dietitian for more information. Where to find more information The [employer] of Diabetes and Digestive and Kidney Diseases: [email or web address] Summary Nonalcoholic fatty liver disease is a condition that causes fat to build up in and around the liver. Following a healthy diet can help to keep nonalcoholic fatty liver disease under control. Your diet should be rich in fruits, vegetables, whole grains, and lean proteins. Limit your intake of saturated fat. Saturated fat is found in foods that come from animals, including meat and dairy products such as butter, cheese, and whole milk. This diet promotes weight loss, helps to control blood sugar levels, and helps to improve the way that the body uses insulin. This information is not intended to replace advice given to you by your health care provider. Make sure you discuss any questions you have with your health  care provider. Document Revised: 06/03/2018 Document Reviewed: 03/03/2018 Elsevier Patient Education  2020 [employer].  Lifestyle and home remedies TO MANAGE REFLUX/HEARTBURN    You may eliminate or reduce the frequency of heartburn by making the following lifestyle changes:   Control your weight. Being overweight is a major risk factor for heartburn and GERD. Excess pounds put pressure on your abdomen, pushing up your stomach and causing acid to back up into your esophagus.    Eat smaller meals. 4 TO 6 MEALS A DAY. This reduces pressure on the lower esophageal sphincter, helping to prevent the valve from opening and acid from washing back into your esophagus.     Loosen your belt. Clothes that fit tightly around your waist put pressure on your abdomen and the lower esophageal sphincter.     Eliminate heartburn triggers. Everyone has specific triggers. Common triggers such as fatty or fried foods, spicy food, tomato sauce, carbonated beverages, alcohol, chocolate, mint, garlic, onion, caffeine and nicotine may make heartburn worse.    Avoid stooping or bending. Tying your shoes is OK. Bending over for longer periods to weed your garden isn't, especially soon after eating.    Don't lie down after a meal. Wait at least three to four hours after eating before going to bed, and don't lie down right after eating.

## 2022-01-22 NOTE — Progress Notes (Signed)
Primary Care Physician:  Curlene Labrum, MD Primary Gastroenterologist:  Dr. Abbey Chatters  Chief Complaint  Patient presents with   New Patient (Initial Visit)    Referred for IBS-D and elevated liver enzymes    HPI:   Kyle Tyler is a 41 y.o. male who presents to the clinic today by referral from his PCP Dr. Pleas Koch for evaluation for abnormal liver function test.  Patient underwent routine yearly physical.  Blood work 11/24/21 showed AST 52, ALT 111.  Patient reportedly had an ultrasound done at [facility] which she states showed fatty liver though I do not have access to this report.  Very limited alcohol use.  Status post living donor resection of his liver in 2014.  No history of diabetes or dyslipidemia.  Patient is overweight with BMI 35.  Has chronic GERD for which he takes omeprazole 40 mg daily.  States this is well-controlled.  No dysphagia odynophagia.  No epigastric or chest pain.  Does have chronic issues with his bowels.  Notes chronic diarrhea 1-5 bowel movements a day.  Very rarely has a normal caliber bowel movement.  Also with some lower abdominal pain associated with this.  Sometimes improved with bowel movements.  Occasionally has taken Imodium to slow things down after diarrhea starts which helps some.  States these issues started after he had his cholecystectomy removed in 2014.  No melena hematochezia.  States he had a colonoscopy 2019.  I do not have access to the report though I do see a telephone note which says it was normal.  Mother and grandmother history of colon cancer.  Mother history of Lynch syndrome.  Patient and has been tested and is negative.   Past Medical History:  Diagnosis Date   Family history of colon cancer    Family history of Lynch syndrome    GERD (gastroesophageal reflux disease)    HTN (hypertension)     Past Surgical History:  Procedure Laterality Date   Left hepatectomy Left     Current Outpatient Medications   Medication Sig Dispense Refill   losartan (COZAAR) 25 MG tablet Take 25 mg by mouth daily.     omeprazole (PRILOSEC) 40 MG capsule Take 40 mg by mouth daily.     No current facility-administered medications for this visit.    Allergies as of 01/22/2022   (Not on File)    Family History  Problem Relation Age of Onset   Colon cancer Mother 57       MLH1 mutation; Lynch syndrome   Colon cancer Maternal Aunt 11       MLH1 mutation   Colon cancer Maternal Grandfather 80       recurrance at 88   Colon cancer Cousin 30       stage IV; Lynch syndrome MLH1   Colon cancer Other        paternal grandfather's brothers    Social History   Socioeconomic History   Marital status: Married    Spouse name: Not on file   Number of children: Not on file   Years of education: Not on file   Highest education level: Not on file  Occupational History   Not on file  Tobacco Use   Smoking status: Former    Packs/day: 0.33    Years: 3.00    Total pack years: 0.99    Types: Cigarettes   Smokeless tobacco: Never  Substance and Sexual Activity   Alcohol use: Yes  Comment: rarely   Drug use: Not on file   Sexual activity: Not on file  Other Topics Concern   Not on file  Social History Narrative   Not on file   Social Determinants of Health   Financial Resource Strain: Not on file  Food Insecurity: Not on file  Transportation Needs: Not on file  Physical Activity: Not on file  Stress: Not on file  Social Connections: Not on file  Intimate Partner Violence: Not on file    Subjective: Review of Systems  Constitutional:  Negative for chills and fever.  HENT:  Negative for congestion and hearing loss.   Eyes:  Negative for blurred vision and double vision.  Respiratory:  Negative for cough and shortness of breath.   Cardiovascular:  Negative for chest pain and palpitations.  Gastrointestinal:  Positive for diarrhea and heartburn. Negative for abdominal pain, blood in stool,  constipation, melena and vomiting.  Genitourinary:  Negative for dysuria and urgency.  Musculoskeletal:  Negative for joint pain and myalgias.  Skin:  Negative for itching and rash.  Neurological:  Negative for dizziness and headaches.  Psychiatric/Behavioral:  Negative for depression. The patient is not nervous/anxious.        Objective: BP (!) 165/93   Pulse 67   Temp 98.4 F (36.9 C)   Ht _0  (1.854 m)   Wt 270 lb 9.6 oz (122.7 kg)   BMI 35.70 kg/m  Physical Exam Constitutional:      Appearance: Normal appearance.  HENT:     Head: Normocephalic and atraumatic.  Eyes:     Extraocular Movements: Extraocular movements intact.     Conjunctiva/sclera: Conjunctivae normal.  Cardiovascular:     Rate and Rhythm: Normal rate and regular rhythm.  Pulmonary:     Effort: Pulmonary effort is normal.     Breath sounds: Normal breath sounds.  Abdominal:     General: Bowel sounds are normal.     Palpations: Abdomen is soft.  Musculoskeletal:        General: Normal range of motion.     Cervical back: Normal range of motion and neck supple.  Skin:    General: Skin is warm.  Neurological:     General: No focal deficit present.     Mental Status: He is alert and oriented to person, place, and time.  Psychiatric:        Mood and Affect: Mood normal.        Behavior: Behavior normal.      Assessment: *Abnormal LFTs *NAFLD *Diarrhea *Chronic GERD-well controlled on Omeprazole  *Abdominal pain-lower *Family history of colon cancer in mother and grandmother  Plan: In regards to patient's abnormal LFTs, likely due to nonalcoholic steatohepatitis.  We will request recent ultrasound.  Repeat LFTs in 3 months.  If still elevated will pursue full serological workup.  Discussed fatty liver in depth with patient today. Recommend 1-2# weight loss per week until ideal body weight through exercise & diet. Low fat/cholesterol diet.   Avoid sweets, sodas, fruit juices, sweetened  beverages like tea, etc. Gradually increase exercise from 15 min daily up to 1 hr per day 5 days/week. Limit alcohol use.  Chronic GERD well-controlled on omeprazole.  Will continue.  In regards to his abdominal pain and chronic diarrhea.  Possible IBS-D versus bile acid induced diarrhea.  Recommend he be more aggressive about taking Imodium in a proactive manner versus reactive.  Recommend taking 2030 minutes before meals especially if he is eating out which  seems to be a trigger for him.  Can consider trial of cholestyramine and/or dicyclomine pending clinical course.  Colonoscopy for high risk colon cancer screening purposes due 2024.  Follow-up in 3 months.  Thank you Dr. Pleas Koch for the kind referral.  01/22/2022 9:41 AM   Disclaimer: This note was dictated with voice recognition software. Similar sounding words can inadvertently be transcribed and may not be corrected upon review.

## 2022-02-19 ENCOUNTER — Ambulatory Visit (INDEPENDENT_AMBULATORY_CARE_PROVIDER_SITE_OTHER): Payer: Self-pay | Admitting: Gastroenterology

## 2022-03-17 LAB — CELIAC AB TTG DGP TIGA
Antigliadin Abs, IgA: 11 units (ref 0–19)
Gliadin IgG: 5 units (ref 0–19)
IgA/Immunoglobulin A, Serum: 221 mg/dL (ref 90–386)
Tissue Transglut Ab: 3 U/mL (ref 0–5)
Transglutaminase IgA: <2 U/mL (ref 0–3)

## 2022-03-17 LAB — C-REACTIVE PROTEIN: CRP: 4 mg/L (ref 0–10)

## 2022-04-23 ENCOUNTER — Ambulatory Visit: Payer: BC Managed Care – PPO | Admitting: Internal Medicine

## 2022-05-13 ENCOUNTER — Encounter: Payer: Self-pay | Admitting: Internal Medicine

## 2022-05-13 ENCOUNTER — Ambulatory Visit (INDEPENDENT_AMBULATORY_CARE_PROVIDER_SITE_OTHER): Payer: BC Managed Care – PPO | Admitting: Internal Medicine

## 2022-05-13 VITALS — BP 132/88 | HR 75 | Temp 98.2°F | Ht 73.0 in | Wt 269.0 lb

## 2022-05-13 DIAGNOSIS — R197 Diarrhea, unspecified: Secondary | ICD-10-CM

## 2022-05-13 DIAGNOSIS — K760 Fatty (change of) liver, not elsewhere classified: Secondary | ICD-10-CM | POA: Diagnosis not present

## 2022-05-13 DIAGNOSIS — K219 Gastro-esophageal reflux disease without esophagitis: Secondary | ICD-10-CM

## 2022-05-13 DIAGNOSIS — R7989 Other specified abnormal findings of blood chemistry: Secondary | ICD-10-CM

## 2022-05-13 NOTE — Patient Instructions (Addendum)
I am happy to hear you are doing well.   I am going to check blood work at [facility] today.  We will call with results.  If your liver tests are still abnormal, I may order more blood work to rule out other possible causes.  Continue on omeprazole daily for your chronic reflux.  Continue Imodium as needed for diarrhea.  Follow-up in 6 months or sooner if needed.  It was nice seeing you again today.  Dr. Abbey Chatters

## 2022-05-13 NOTE — Progress Notes (Signed)
Primary Care Physician:  Curlene Labrum, MD Primary Gastroenterologist:  Dr. Abbey Chatters  Chief Complaint  Patient presents with   Gastroesophageal Reflux    Follow up on GERD, fatty liver, diarrhea and abdominal pain. Reports he is doing better and no concerns today.     HPI:   Kyle Tyler is a 42 y.o. male who presents to the clinic today for follow up visit.  Patient underwent routine yearly physical.  Blood work 11/24/21 showed AST 52, ALT 111.  Patient reportedly had an ultrasound done at [facility] which he states showed fatty liver though I do not have access to this report.  Very limited alcohol use.  Status post living donor resection of his liver in 2014.  No history of diabetes or dyslipidemia.  Patient is overweight with BMI 35.  Has chronic GERD for which he takes omeprazole 40 mg daily.  States this is well-controlled.  No dysphagia odynophagia.  No epigastric or chest pain.  Has had issues with IBS with chronic diarrhea in the past.  This is improved on Imodium which he takes as needed.  If he takes it too much he becomes constipated.  CRP and celiac testing WNL.   States these issues started after he had his cholecystectomy removed in 2014.  No melena hematochezia.  Last colonoscopy 2019.  I do not have access to the report though I do see a telephone note which says it was normal recommended 10-year recall.  Mother and grandmother history of colon cancer.  Mother history of Lynch syndrome.  Patient has been tested and is negative.   Past Medical History:  Diagnosis Date   Family history of colon cancer    Family history of Lynch syndrome    GERD (gastroesophageal reflux disease)    HTN (hypertension)     Past Surgical History:  Procedure Laterality Date   Left hepatectomy Left     Current Outpatient Medications  Medication Sig Dispense Refill   losartan (COZAAR) 25 MG tablet Take 25 mg by mouth daily.     omeprazole (PRILOSEC) 40 MG capsule Take 40  mg by mouth daily.     No current facility-administered medications for this visit.    Allergies as of 05/13/2022   (No Known Allergies)    Family History  Problem Relation Age of Onset   Colon cancer Mother 51       MLH1 mutation; Lynch syndrome   Colon cancer Maternal Aunt 22       MLH1 mutation   Colon cancer Maternal Grandfather 54       recurrance at 36   Colon cancer Cousin 30       stage IV; Lynch syndrome MLH1   Colon cancer Other        paternal grandfather's brothers    Social History   Socioeconomic History   Marital status: Married    Spouse name: Not on file   Number of children: Not on file   Years of education: Not on file   Highest education level: Not on file  Occupational History   Not on file  Tobacco Use   Smoking status: Former    Packs/day: 0.33    Years: 3.00    Additional pack years: 0.00    Total pack years: 0.99    Types: Cigarettes    Passive exposure: Past   Smokeless tobacco: Current    Types: Snuff  Substance and Sexual Activity   Alcohol use: Yes  Comment: rarely   Drug use: Never   Sexual activity: Not on file  Other Topics Concern   Not on file  Social History Narrative   Not on file   Social Determinants of Health   Financial Resource Strain: Not on file  Food Insecurity: Not on file  Transportation Needs: Not on file  Physical Activity: Not on file  Stress: Not on file  Social Connections: Not on file  Intimate Partner Violence: Not on file    Subjective: Review of Systems  Constitutional:  Negative for chills and fever.  HENT:  Negative for congestion and hearing loss.   Eyes:  Negative for blurred vision and double vision.  Respiratory:  Negative for cough and shortness of breath.   Cardiovascular:  Negative for chest pain and palpitations.  Gastrointestinal:  Positive for diarrhea and heartburn. Negative for abdominal pain, blood in stool, constipation, melena and vomiting.  Genitourinary:  Negative for  dysuria and urgency.  Musculoskeletal:  Negative for joint pain and myalgias.  Skin:  Negative for itching and rash.  Neurological:  Negative for dizziness and headaches.  Psychiatric/Behavioral:  Negative for depression. The patient is not nervous/anxious.        Objective: BP 132/88 (BP Location: Right Arm, Patient Position: Sitting, Cuff Size: Large)   Pulse 75   Temp 98.2 F (36.8 C) (Oral)   Ht 6\' 1"  (1.854 m)   Wt 269 lb (122 kg)   BMI 35.49 kg/m  Physical Exam Constitutional:      Appearance: Normal appearance.  HENT:     Head: Normocephalic and atraumatic.  Eyes:     Extraocular Movements: Extraocular movements intact.     Conjunctiva/sclera: Conjunctivae normal.  Cardiovascular:     Rate and Rhythm: Normal rate and regular rhythm.  Pulmonary:     Effort: Pulmonary effort is normal.     Breath sounds: Normal breath sounds.  Abdominal:     General: Bowel sounds are normal.     Palpations: Abdomen is soft.  Musculoskeletal:        General: Normal range of motion.     Cervical back: Normal range of motion and neck supple.  Skin:    General: Skin is warm.  Neurological:     General: No focal deficit present.     Mental Status: He is alert and oriented to person, place, and time.  Psychiatric:        Mood and Affect: Mood normal.        Behavior: Behavior normal.      Assessment: *Abnormal LFTs *NAFLD *Diarrhea *Chronic GERD-well controlled on Omeprazole  *Abdominal pain-lower *Family history of colon cancer in mother and grandmother  Plan: In regards to patient's abnormal LFTs, likely due to nonalcoholic steatohepatitis.    Recheck blood work today including CBC, CMP, INR.  If LFTs still elevated, will proceed with full serological workup.  Discussed fatty liver in depth with patient today. Recommend 1-2# weight loss per week until ideal body weight through exercise & diet. Low fat/cholesterol diet.   Avoid sweets, sodas, fruit juices, swezetened  beverages like tea, etc. Gradually increase exercise from 15 min daily up to 1 hr per day 5 days/week. Limit alcohol use.  Chronic GERD well-controlled on omeprazole.  Will continue.  In regards to his abdominal pain and chronic diarrhea.  Possible IBS-D versus bile acid induced diarrhea.  This is much improved on as needed Imodium.  Continue.  He is also identified certain foods which trigger his symptoms  and is avoiding which is helping.  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  Can consider trial of cholestyramine and/or dicyclomine pending clinical course.  Colonoscopy due 2029  Follow-up in 6 months.   05/13/2022 8:41 AM   Disclaimer: This note was dictated with voice recognition software. Similar sounding words can inadvertently be transcribed and may not be corrected upon review.

## 2022-05-14 LAB — COMPREHENSIVE METABOLIC PANEL
ALT: 99 IU/L — ABNORMAL HIGH (ref 0–44)
AST: 58 IU/L — ABNORMAL HIGH (ref 0–40)
Albumin/Globulin Ratio: 1.6 (ref 1.2–2.2)
Albumin: 4.5 g/dL (ref 4.1–5.1)
Alkaline Phosphatase: 79 IU/L (ref 44–121)
BUN/Creatinine Ratio: 15 (ref 9–20)
BUN: 16 mg/dL (ref 6–20)
Bilirubin Total: 0.5 mg/dL (ref 0.0–1.2)
CO2: 21 mmol/L (ref 20–29)
Calcium: 9.7 mg/dL (ref 8.7–10.2)
Chloride: 100 mmol/L (ref 96–106)
Creatinine, Ser: 1.07 mg/dL (ref 0.76–1.27)
Globulin, Total: 2.8 g/dL (ref 1.5–4.5)
Glucose: 101 mg/dL — ABNORMAL HIGH (ref 70–99)
Potassium: 4.6 mmol/L (ref 3.5–5.2)
Sodium: 138 mmol/L (ref 134–144)
Total Protein: 7.3 g/dL (ref 6.0–8.5)
eGFR: 91 mL/min/{1.73_m2} (ref >59)

## 2022-05-14 LAB — CBC
Hematocrit: 46.1 % (ref 37.5–51.0)
Hemoglobin: 16 g/dL (ref 13.0–17.7)
MCH: 31 pg (ref 26.6–33.0)
MCHC: 34.7 g/dL (ref 31.5–35.7)
MCV: 89 fL (ref 79–97)
Platelets: 233 10*3/uL (ref 150–450)
RBC: 5.16 x10E6/uL (ref 4.14–5.80)
RDW: 12.5 % (ref 11.6–15.4)
WBC: 5.9 10*3/uL (ref 3.4–10.8)

## 2022-05-14 LAB — PROTIME-INR
INR: 1 (ref 0.9–1.2)
Prothrombin Time: 10.4 s (ref 9.1–12.0)

## 2022-07-27 ENCOUNTER — Encounter: Payer: Self-pay | Admitting: Internal Medicine

## 2022-11-18 ENCOUNTER — Ambulatory Visit (INDEPENDENT_AMBULATORY_CARE_PROVIDER_SITE_OTHER): Payer: BC Managed Care – PPO | Admitting: Internal Medicine

## 2022-11-18 ENCOUNTER — Encounter: Payer: Self-pay | Admitting: Internal Medicine

## 2022-11-18 VITALS — BP 128/87 | HR 96 | Temp 98.3°F | Ht 73.0 in | Wt 270.9 lb

## 2022-11-18 DIAGNOSIS — K219 Gastro-esophageal reflux disease without esophagitis: Secondary | ICD-10-CM

## 2022-11-18 DIAGNOSIS — R7989 Other specified abnormal findings of blood chemistry: Secondary | ICD-10-CM

## 2022-11-18 DIAGNOSIS — R109 Unspecified abdominal pain: Secondary | ICD-10-CM | POA: Diagnosis not present

## 2022-11-18 DIAGNOSIS — K580 Irritable bowel syndrome with diarrhea: Secondary | ICD-10-CM

## 2022-11-18 DIAGNOSIS — R197 Diarrhea, unspecified: Secondary | ICD-10-CM

## 2022-11-18 DIAGNOSIS — K7581 Nonalcoholic steatohepatitis (NASH): Secondary | ICD-10-CM

## 2022-11-18 DIAGNOSIS — R1030 Lower abdominal pain, unspecified: Secondary | ICD-10-CM

## 2022-11-18 NOTE — Progress Notes (Signed)
Primary Care Physician:  Juliette Alcide, MD Primary Gastroenterologist:  Dr. Marletta Lor  Chief Complaint  Patient presents with   Diarrhea    Follow up on diarrhea. Still having some issues. Thinks maybe coming from stress. Mostly happens at work. Take imodium at times. Works sometimes.    Gastroesophageal Reflux    Followup on gerd. States doing fine with omeprazole once daily.     HPI:   Kyle Tyler is a 42 y.o. male who presents to the clinic today for follow up visit.  MASH: Blood work 11/24/21 showed AST 52, ALT 111.  Patient reportedly had an ultrasound done at [facility] which he states showed fatty liver though I do not have access to this report.  Very limited alcohol use.  Status post living donor resection of his liver in 2014.  No history of diabetes or dyslipidemia.  Patient is overweight with BMI 35.  Blood work 05/13/2022 with AST 58, ALT 99, T. bili 0.5, alk phos 79.  Patient has not lost any weight since I saw him 6 months ago.  GERD: currently taking omeprazole 40 mg daily.  States this is well-controlled.  No dysphagia odynophagia.  No epigastric or chest pain.  Diarrhea, abdominal pain: This is improved on Imodium which he takes as needed.  If he takes it too much he becomes constipated.  Notes stress worsens his symptoms.  CRP and celiac testing WNL.   States these issues started after he had his cholecystectomy removed in 2014.  No melena hematochezia.  Last colonoscopy 2019.  I do not have access to the report though I do see a telephone note which says it was normal recommended 10-year recall.  Mother and grandmother history of colon cancer.  Mother history of Lynch syndrome.  Patient has been tested and is negative.   Past Medical History:  Diagnosis Date   Family history of colon cancer    Family history of Lynch syndrome    GERD (gastroesophageal reflux disease)    HTN (hypertension)     Past Surgical History:  Procedure Laterality Date    Left hepatectomy Left     Current Outpatient Medications  Medication Sig Dispense Refill   losartan (COZAAR) 25 MG tablet Take 25 mg by mouth daily.     omeprazole (PRILOSEC) 40 MG capsule Take 40 mg by mouth daily.     No current facility-administered medications for this visit.    Allergies as of 11/18/2022   (No Known Allergies)    Family History  Problem Relation Age of Onset   Colon cancer Mother 32       MLH1 mutation; Lynch syndrome   Colon cancer Maternal Aunt 66       MLH1 mutation   Colon cancer Maternal Grandfather 50       recurrance at 79   Colon cancer Cousin 30       stage IV; Lynch syndrome MLH1   Colon cancer Other        paternal grandfather's brothers    Social History   Socioeconomic History   Marital status: Married    Spouse name: Not on file   Number of children: Not on file   Years of education: Not on file   Highest education level: Not on file  Occupational History   Not on file  Tobacco Use   Smoking status: Former    Current packs/day: 0.33    Average packs/day: 0.3 packs/day for 3.0 years (1.0 ttl pk-yrs)  Types: Cigarettes    Passive exposure: Past   Smokeless tobacco: Current    Types: Snuff  Substance and Sexual Activity   Alcohol use: Yes    Comment: rarely   Drug use: Never   Sexual activity: Not on file  Other Topics Concern   Not on file  Social History Narrative   Not on file   Social Determinants of Health   Financial Resource Strain: Not on file  Food Insecurity: Not on file  Transportation Needs: Not on file  Physical Activity: Not on file  Stress: Not on file  Social Connections: Not on file  Intimate Partner Violence: Not on file    Subjective: Review of Systems  Constitutional:  Negative for chills and fever.  HENT:  Negative for congestion and hearing loss.   Eyes:  Negative for blurred vision and double vision.  Respiratory:  Negative for cough and shortness of breath.   Cardiovascular:  Negative  for chest pain and palpitations.  Gastrointestinal:  Positive for diarrhea and heartburn. Negative for abdominal pain, blood in stool, constipation, melena and vomiting.  Genitourinary:  Negative for dysuria and urgency.  Musculoskeletal:  Negative for joint pain and myalgias.  Skin:  Negative for itching and rash.  Neurological:  Negative for dizziness and headaches.  Psychiatric/Behavioral:  Negative for depression. The patient is not nervous/anxious.        Objective: BP 128/87 (BP Location: Left Arm, Patient Position: Sitting, Cuff Size: Large)   Pulse 96   Temp 98.3 F (36.8 C) (Oral)   Ht 6\' 1"  (1.854 m)   Wt 270 lb 14.4 oz (122.9 kg)   BMI 35.74 kg/m  Physical Exam Constitutional:      Appearance: Normal appearance.  HENT:     Head: Normocephalic and atraumatic.  Eyes:     Extraocular Movements: Extraocular movements intact.     Conjunctiva/sclera: Conjunctivae normal.  Cardiovascular:     Rate and Rhythm: Normal rate and regular rhythm.  Pulmonary:     Effort: Pulmonary effort is normal.     Breath sounds: Normal breath sounds.  Abdominal:     General: Bowel sounds are normal.     Palpations: Abdomen is soft.  Musculoskeletal:        General: Normal range of motion.     Cervical back: Normal range of motion and neck supple.  Skin:    General: Skin is warm.  Neurological:     General: No focal deficit present.     Mental Status: He is alert and oriented to person, place, and time.  Psychiatric:        Mood and Affect: Mood normal.        Behavior: Behavior normal.      Assessment/Plan:  1.  MASH-etiology of his abnormal LFTs likely due to MASH, blood work 05/13/2022 with AST 58, ALT 99, T. bili 0.5, alk phos 79. Patient reportedly had an ultrasound done at [facility] which he states showed fatty liver though I do not have access to this report.  Unable to calculate fib 4 as patient has not had recent platelet count.  Very limited alcohol use.  Status post  living donor resection of his liver in 2014.  Discussed fatty liver in depth with patient today. Recommend 1-2# weight loss per week until ideal body weight through exercise & diet. Low fat/cholesterol diet.   Avoid sweets, sodas, fruit juices, swezetened beverages like tea, etc. Gradually increase exercise from 15 min daily up to 1 hr  per day 5 days/week. Limit alcohol use.  Recheck CBC, CMP today.  Given consistent elevation, we will perform further serological workup as well.  Call with results.  Patient inquiring about GLP-1, I discussed that there is data suggest that GLP-1's have shown improvement in both biochemical and histological markers of fatty liver.  He will discuss further about starting 1 with his PCP Dr. Leandrew Koyanagi.  2.  Chronic GERD well-controlled on omeprazole.  Will continue.  3.  Abdominal pain and chronic diarrhea.  Likely IBS-D versus bile acid induced diarrhea.  This is much improved on as needed Imodium.  Continue.  He is also identified certain foods which trigger his symptoms and is avoiding which is helping.  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  Can consider trial of cholestyramine and/or dicyclomine pending clinical course.  Colonoscopy due 2029  Follow-up in 6 months.   11/18/2022 10:43 AM   Disclaimer: This note was dictated with voice recognition software. Similar sounding words can inadvertently be transcribed and may not be corrected upon review.

## 2022-11-18 NOTE — Patient Instructions (Signed)
I am going to order blood work today at [facility] to check your liver numbers as well as to screen you for autoimmune hepatitis, viral hepatitis, iron disorder.  We will call with results.  Recommend 1-2# weight loss per week until ideal body weight through exercise & diet. Low fat/cholesterol diet.   Avoid sweets, sodas, fruit juices, sweetened beverages like tea, etc. Gradually increase exercise from 15 min daily up to 1 hr per day 5 days/week. Limit alcohol use.  I think starting on a GLP-1 medication would be helpful as far as weight loss and your fatty liver.  Would recommend you discuss further with Dr. Leandrew Koyanagi.  Continue on omeprazole for your chronic reflux.  Continue Imodium as needed for your irritable bowel syndrome.  Follow-up in 6 months.  It was very nice seeing you again today.  Dr. Marletta Lor

## 2022-11-19 ENCOUNTER — Ambulatory Visit: Payer: BC Managed Care – PPO | Admitting: Internal Medicine

## 2022-11-23 LAB — ACUTE VIRAL HEPATITIS (HAV, HBV, HCV)
HCV Ab: NONREACTIVE
Hep A IgM: NEGATIVE
Hep B C IgM: NEGATIVE
Hepatitis B Surface Ag: NEGATIVE

## 2022-11-23 LAB — IMMUNOGLOBULINS A/E/G/M, SERUM
IgA/Immunoglobulin A, Serum: 234 mg/dL (ref 90–386)
IgE (Immunoglobulin E), Serum: 187 [IU]/mL (ref 6–495)
IgG (Immunoglobin G), Serum: 1137 mg/dL (ref 603–1613)
IgM (Immunoglobulin M), Srm: 147 mg/dL (ref 20–172)

## 2022-11-23 LAB — CBC
Hematocrit: 47.8 % (ref 37.5–51.0)
Hemoglobin: 16.1 g/dL (ref 13.0–17.7)
MCH: 31.3 pg (ref 26.6–33.0)
MCHC: 33.7 g/dL (ref 31.5–35.7)
MCV: 93 fL (ref 79–97)
Platelets: 195 10*3/uL (ref 150–450)
RBC: 5.15 x10E6/uL (ref 4.14–5.80)
RDW: 12.2 % (ref 11.6–15.4)
WBC: 4.6 10*3/uL (ref 3.4–10.8)

## 2022-11-23 LAB — COMPREHENSIVE METABOLIC PANEL
ALT: 92 [IU]/L — ABNORMAL HIGH (ref 0–44)
AST: 54 [IU]/L — ABNORMAL HIGH (ref 0–40)
Albumin: 4.4 g/dL (ref 4.1–5.1)
Alkaline Phosphatase: 67 [IU]/L (ref 44–121)
BUN/Creatinine Ratio: 15 (ref 9–20)
BUN: 17 mg/dL (ref 6–24)
Bilirubin Total: 0.6 mg/dL (ref 0.0–1.2)
CO2: 24 mmol/L (ref 20–29)
Calcium: 9.5 mg/dL (ref 8.7–10.2)
Chloride: 99 mmol/L (ref 96–106)
Creatinine, Ser: 1.12 mg/dL (ref 0.76–1.27)
Globulin, Total: 2.6 g/dL (ref 1.5–4.5)
Glucose: 99 mg/dL (ref 70–99)
Potassium: 4.6 mmol/L (ref 3.5–5.2)
Sodium: 137 mmol/L (ref 134–144)
Total Protein: 7 g/dL (ref 6.0–8.5)
eGFR: 85 mL/min/{1.73_m2} (ref >59)

## 2022-11-23 LAB — IRON,TIBC AND FERRITIN PANEL
Ferritin: 390 ng/mL (ref 30–400)
Iron Saturation: 45 % (ref 15–55)
Iron: 126 ug/dL (ref 38–169)
Total Iron Binding Capacity: 283 ug/dL (ref 250–450)
UIBC: 157 ug/dL (ref 111–343)

## 2022-11-23 LAB — ANA

## 2022-11-23 LAB — HEPATITIS B SURFACE ANTIBODY,QUALITATIVE: Hep B Surface Ab, Qual: REACTIVE

## 2022-11-23 LAB — ANTI-SMOOTH MUSCLE ANTIBODY, IGG: Smooth Muscle Ab: 13 U (ref 0–19)

## 2022-11-23 LAB — HCV INTERPRETATION

## 2023-04-23 ENCOUNTER — Encounter: Payer: Self-pay | Admitting: Internal Medicine
